# Patient Record
Sex: Female | Born: 2000 | Hispanic: Yes | Marital: Single | Smoking: Former smoker
Health system: Southern US, Community
[De-identification: ages and names within clinical notes are randomized; demographics above are authoritative.]

## PROBLEM LIST (undated history)

## (undated) DIAGNOSIS — Z789 Other specified health status: Secondary | ICD-10-CM

## (undated) HISTORY — PX: NO PAST SURGERIES: SHX2092

## (undated) HISTORY — DX: Other specified health status: Z78.9

---
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
## 2021-07-12 ENCOUNTER — Ambulatory Visit (LOCAL_COMMUNITY_HEALTH_CENTER): Payer: Self-pay

## 2021-07-12 VITALS — BP 108/61 | Ht 61.02 in | Wt 118.5 lb

## 2021-07-12 DIAGNOSIS — Z3201 Encounter for pregnancy test, result positive: Secondary | ICD-10-CM

## 2021-07-12 LAB — PREGNANCY, URINE: Preg Test, Ur: POSITIVE — AB

## 2021-07-12 MED ORDER — PRENATAL 27-0.8 MG PO TABS: 1.0000 | ORAL_TABLET | Freq: Every day | ORAL | 0 refills | Status: AC
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
## 2021-07-25 ENCOUNTER — Telehealth: Payer: Self-pay

## 2021-07-25 NOTE — Telephone Encounter (Signed)
TC to patient who has new OB visit at [facility] tomorrow. Per notes in [facility] epic, patient was to start care today at [facility]. Call with interpreter to find out if patient plans to have maternity care at [facility] or elsewhere. LM with number to call. Intepreter V. Olmedo.Burt Knack, RN  ?

## 2021-07-27 NOTE — Telephone Encounter (Signed)
Telephone call to [facility] Department this morning to inquire about New OB appointment on 07-25-2021.  Per Verlon Au in the [facility] clinic, patient cancelled her new OB appointment.  Per [facility] policy, they do not follow-up with patient who cancel their own appointments for new appointments. Hart Carwin, RN ? ?

## 2021-07-27 NOTE — Telephone Encounter (Signed)
Telephone call to patient today regarding where she would like to have her prenatal care.  She has decided to go with [facility] due to it being closer.  Appointment was actually yesterday 07-26-2021, but was cancelled due to staffing.  New appointment is scheduled for 08-03-2021 at 2:20 pm (patient thought it was 2:45).  Language Line used today.  Felicita Gage / IV:[id number] Dahlia Bailiff, RN ? ?

## 2021-08-03 ENCOUNTER — Ambulatory Visit: Payer: Medicaid Other | Admitting: Advanced Practice Midwife

## 2021-08-03 ENCOUNTER — Encounter: Payer: Self-pay | Admitting: Advanced Practice Midwife

## 2021-08-03 DIAGNOSIS — T7412XS Child physical abuse, confirmed, sequela: Secondary | ICD-10-CM

## 2021-08-03 DIAGNOSIS — Z23 Encounter for immunization: Secondary | ICD-10-CM

## 2021-08-03 DIAGNOSIS — Z3480 Encounter for supervision of other normal pregnancy, unspecified trimester: Secondary | ICD-10-CM | POA: Insufficient documentation

## 2021-08-03 DIAGNOSIS — T7412XA Child physical abuse, confirmed, initial encounter: Secondary | ICD-10-CM | POA: Insufficient documentation

## 2021-08-03 DIAGNOSIS — Z3481 Encounter for supervision of other normal pregnancy, first trimester: Secondary | ICD-10-CM | POA: Diagnosis not present

## 2021-08-03 DIAGNOSIS — Z7289 Other problems related to lifestyle: Secondary | ICD-10-CM

## 2021-08-03 DIAGNOSIS — Z3401 Encounter for supervision of normal first pregnancy, first trimester: Secondary | ICD-10-CM | POA: Insufficient documentation

## 2021-08-03 HISTORY — DX: Encounter for supervision of other normal pregnancy, unspecified trimester: Z34.80

## 2021-08-03 LAB — URINALYSIS
Bilirubin, UA: NEGATIVE
Glucose, UA: NEGATIVE
Nitrite, UA: POSITIVE — AB
Protein,UA: NEGATIVE
RBC, UA: NEGATIVE
Specific Gravity, UA: 1.025 (ref 1.005–1.030)
Urobilinogen, Ur: 0.2 mg/dL (ref 0.2–1.0)
pH, UA: 6.5 (ref 5.0–7.5)

## 2021-08-03 LAB — WET PREP FOR TRICH, YEAST, CLUE
Trichomonas Exam: NEGATIVE
Yeast Exam: NEGATIVE

## 2021-08-03 LAB — HEMOGLOBIN, FINGERSTICK: Hemoglobin: 11.8 g/dL (ref 11.1–15.9)

## 2021-08-03 NOTE — Progress Notes (Addendum)
Currently lives with friend and her child who is 20 years old. Has contact with FOB, lives in [place]. Moved to [place] 4 years ago. Confirmed with patient correct spelling of name "Samantha Butler".Confirmed that [facility] record on file is patient's record. After Visit Summary given to patient. [facility] Spanish Interpretor utilized. Alesia Banda RN ?
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
## 2021-08-04 ENCOUNTER — Other Ambulatory Visit: Payer: Self-pay | Admitting: Advanced Practice Midwife

## 2021-08-04 DIAGNOSIS — Z3401 Encounter for supervision of normal first pregnancy, first trimester: Secondary | ICD-10-CM

## 2021-08-04 LAB — LEAD, BLOOD (ADULT >= 16 YRS): Lead-Whole Blood: <1 ug/dL (ref 0.0–3.4)

## 2021-08-05 ENCOUNTER — Telehealth: Payer: Self-pay

## 2021-08-05 LAB — 789231 7+OXYCODONE-BUND
Amphetamines, Urine: NEGATIVE ng/mL
BENZODIAZ UR QL: NEGATIVE ng/mL
Barbiturate screen, urine: NEGATIVE ng/mL
Cannabinoid Quant, Ur: NEGATIVE ng/mL
Cocaine (Metab.): NEGATIVE ng/mL
OPIATE SCREEN URINE: NEGATIVE ng/mL
Oxycodone/Oxymorphone, Urine: NEGATIVE ng/mL
PCP Quant, Ur: NEGATIVE ng/mL

## 2021-08-05 LAB — HGB FRACTIONATION CASCADE
Hgb A2: 2.8 % (ref 1.8–3.2)
Hgb A: 96.7 % (ref 96.4–98.8)
Hgb F: 0.5 % (ref 0.0–2.0)
Hgb S: 0 %

## 2021-08-05 LAB — CHLAMYDIA/GC NAA, CONFIRMATION
Chlamydia trachomatis, NAA: NEGATIVE
Neisseria gonorrhoeae, NAA: NEGATIVE

## 2021-08-05 NOTE — Telephone Encounter (Signed)
Call to [facility], [facility]) MFM [place] scheduler to obtain US appt for client. Order updated in Epic yesterday by Hazle Coca CNM and paper referral faxed this am with confirmation received. Britta Mccreedy requested phone number to return call as unable to schedule at this time as involved with CPT codes. Number for her to call provided. Jossie Ng, RN ? ?

## 2021-08-06 LAB — URINE CULTURE

## 2021-08-08 ENCOUNTER — Encounter: Payer: Self-pay | Admitting: Advanced Practice Midwife

## 2021-08-08 ENCOUNTER — Telehealth: Payer: Self-pay

## 2021-08-08 DIAGNOSIS — O2340 Unspecified infection of urinary tract in pregnancy, unspecified trimester: Secondary | ICD-10-CM

## 2021-08-08 HISTORY — DX: Unspecified infection of urinary tract in pregnancy, unspecified trimester: O23.40

## 2021-08-08 LAB — CBC/D/PLT+RPR+RH+ABO+RUB AB...
Antibody Screen: NEGATIVE
Basophils Absolute: 0 10*3/uL (ref 0.0–0.2)
Basos: 0 %
EOS (ABSOLUTE): 0.1 10*3/uL (ref 0.0–0.4)
Eos: 1 %
Hematocrit: 35.1 % (ref 34.0–46.6)
Hemoglobin: 12 g/dL (ref 11.1–15.9)
Hepatitis B Surface Ag: NEGATIVE
Immature Grans (Abs): 0 10*3/uL (ref 0.0–0.1)
Immature Granulocytes: 0 %
Lymphocytes Absolute: 2.3 10*3/uL (ref 0.7–3.1)
Lymphs: 28 %
MCH: 29.8 pg (ref 26.6–33.0)
MCHC: 34.2 g/dL (ref 31.5–35.7)
MCV: 87 fL (ref 79–97)
Monocytes Absolute: 0.5 10*3/uL (ref 0.1–0.9)
Monocytes: 6 %
Neutrophils Absolute: 5.1 10*3/uL (ref 1.4–7.0)
Neutrophils: 65 %
Platelets: 301 10*3/uL (ref 150–450)
RBC: 4.03 x10E6/uL (ref 3.77–5.28)
RDW: 13.3 % (ref 11.7–15.4)
RPR Ser Ql: NONREACTIVE
Rh Factor: POSITIVE
Rubella Antibodies, IGG: 2.5 index (ref >0.99)
Varicella zoster IgG: 1517 index (ref >165)
WBC: 8 10*3/uL (ref 3.4–10.8)

## 2021-08-08 LAB — HIV-1/HIV-2 QUALITATIVE RNA
HIV-1 RNA, Qualitative: NONREACTIVE
HIV-2 RNA, Qualitative: NONREACTIVE

## 2021-08-08 LAB — QUANTIFERON-TB GOLD PLUS
QuantiFERON Mitogen Value: >10 IU/mL
QuantiFERON Nil Value: 0.15 IU/mL
QuantiFERON TB1 Ag Value: 0.13 IU/mL
QuantiFERON TB2 Ag Value: 0.24 IU/mL
QuantiFERON-TB Gold Plus: NEGATIVE

## 2021-08-08 NOTE — Telephone Encounter (Signed)
Appt for UTI treatment scheduled for 08/11/2021. Samantha Ng, RN ? ?

## 2021-08-08 NOTE — Telephone Encounter (Signed)
Client needs UTI treatment and call made to client to schedule appt by Delynn Flavin RN, but difficulty hearing client and call dropped. [employer] dialed number again with Ms. Thiele on the line. Message left to call regarding appt. Client just returned call and Ms. Cardenas, [facility] clerical, scheduled her for 08/11/2021 with arrival time of 0800. Jossie Ng, RN ? ?

## 2021-08-08 NOTE — Telephone Encounter (Signed)
Appointment needed for Urine Culture result. Called patient via Electrical engineer # [phone].  ?Patient answered phone and discussed the need for an appointment for treatment.  ?However during discussion the patient no longer was responding to the interpretor - period of silence.  ?Interpretor called patient again and call forwarded to voice mail.  ?Spanish Interpretor left message for patient to return call [phone]) and schedule appointment this week.  ?Alesia Banda RN ?

## 2021-08-09 ENCOUNTER — Telehealth: Payer: Self-pay

## 2021-08-09 NOTE — Telephone Encounter (Signed)
Call to client with Samantha Butler to give [facility] MFM [place] appt which is scheduled on 09/13/2021 with arrival time of 1245 (for 1300 [place]). Jossie Ng, RN ? ?

## 2021-08-11 ENCOUNTER — Ambulatory Visit: Payer: Medicaid Other | Admitting: Family Medicine

## 2021-08-11 VITALS — BP 117/60 | HR 101 | Temp 98.4°F | Wt 121.6 lb

## 2021-08-11 DIAGNOSIS — O2342 Unspecified infection of urinary tract in pregnancy, second trimester: Secondary | ICD-10-CM | POA: Diagnosis not present

## 2021-08-11 DIAGNOSIS — Z3402 Encounter for supervision of normal first pregnancy, second trimester: Secondary | ICD-10-CM

## 2021-08-11 MED ORDER — NITROFURANTOIN MONOHYD MACRO 100 MG PO CAPS: 100.0000 mg | ORAL_CAPSULE | Freq: Two times a day (BID) | ORAL | 0 refills | Status: DC

## 2021-08-11 NOTE — Progress Notes (Signed)
Patient here for UTI treatment. States has information about how to get to the hospital for her U/S on 09/13/21. Pediatrician list given to patient along with [facility] pamphlet and patient counseled to choose a BCM method and peds before birth of baby. Plans to breastfeed.Burt Knack, RN  ?
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
## 2021-08-23 NOTE — Addendum Note (Signed)
Addended by: Heywood Bene on: 08/23/2021 02:19 PM ? ? Modules accepted: Orders ? ?

## 2021-08-31 ENCOUNTER — Ambulatory Visit: Payer: Medicaid Other | Admitting: Nurse Practitioner

## 2021-08-31 VITALS — BP 108/60 | HR 100 | Temp 98.5°F | Wt 125.0 lb

## 2021-08-31 DIAGNOSIS — Z3482 Encounter for supervision of other normal pregnancy, second trimester: Secondary | ICD-10-CM | POA: Diagnosis not present

## 2021-08-31 DIAGNOSIS — Z3480 Encounter for supervision of other normal pregnancy, unspecified trimester: Secondary | ICD-10-CM

## 2021-08-31 NOTE — Progress Notes (Signed)
Here today for 17.0 week MH RV. Taking PNV QD. Denies ED/hospital visits since last RV. Urine TOC today. Aware of 09/13/21 [facility] MFM [place] appt. Tawny Hopping, RN

## 2021-09-01 ENCOUNTER — Encounter: Payer: Self-pay | Admitting: Nurse Practitioner
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
## 2021-09-03 LAB — URINE CULTURE

## 2021-09-07 ENCOUNTER — Telehealth: Payer: Self-pay

## 2021-09-07 NOTE — Telephone Encounter (Signed)
TC to patient to inform of UTI and need for treatment. Patient scheduled for Friday morning as an overbook, due to lack of available appointments. Patient counseled to arrive 8:00 am. Patient states understanding. [address] interpreters, [place], [place] [phone].Marland KitchenBurt Knack, RN

## 2021-09-08 ENCOUNTER — Other Ambulatory Visit: Payer: Self-pay

## 2021-09-09 ENCOUNTER — Ambulatory Visit: Payer: Self-pay | Admitting: Advanced Practice Midwife

## 2021-09-09 ENCOUNTER — Telehealth: Payer: Self-pay

## 2021-09-09 VITALS — BP 121/75 | HR 105 | Temp 97.1°F | Wt 127.0 lb

## 2021-09-09 DIAGNOSIS — O2342 Unspecified infection of urinary tract in pregnancy, second trimester: Secondary | ICD-10-CM

## 2021-09-09 DIAGNOSIS — O2340 Unspecified infection of urinary tract in pregnancy, unspecified trimester: Secondary | ICD-10-CM

## 2021-09-09 DIAGNOSIS — Z3482 Encounter for supervision of other normal pregnancy, second trimester: Secondary | ICD-10-CM

## 2021-09-09 DIAGNOSIS — Z3480 Encounter for supervision of other normal pregnancy, unspecified trimester: Secondary | ICD-10-CM

## 2021-09-09 HISTORY — DX: Unspecified infection of urinary tract in pregnancy, unspecified trimester: O23.40

## 2021-09-09 MED ORDER — NITROFURANTOIN MONOHYD MACRO 100 MG PO CAPS: 100.0000 mg | ORAL_CAPSULE | Freq: Two times a day (BID) | ORAL | 0 refills | Status: DC

## 2021-09-09 NOTE — Progress Notes (Signed)
Presents for UTI treatment only. Aware of [facility] M[facility] 09/13/21 [place] appt. Aware of [facility] RV appt 09/28/21. Jossie Ng, RN

## 2021-09-09 NOTE — Telephone Encounter (Signed)
AFP only ordered 08/31/2021 and wt not included in order so test could not be done. Call to Emily and transferred to lab where this test performed. %/24/2023 wt provided and per female, test results should be available in ~ 15 minutes. Rich Number, RN
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
## 2021-09-10 LAB — MATERNIT21  PLUS CORE+ESS+SCA, BLOOD
11q23 deletion (Jacobsen): NOT DETECTED
15q11 deletion (PW Angelman): NOT DETECTED
1p36 deletion syndrome: NOT DETECTED
22q11 deletion (DiGeorge): NOT DETECTED
4p16 deletion(Wolf-Hirschhorn): NOT DETECTED
5p15 deletion (Cri-du-chat): NOT DETECTED
8q24 deletion (Langer-Giedion): NOT DETECTED
Fetal Fraction: 15
Monosomy X (Turner Syndrome): NOT DETECTED
Result (T21): NEGATIVE
Trisomy 13 (Patau syndrome): NEGATIVE
Trisomy 16: NOT DETECTED
Trisomy 18 (Edwards syndrome): NEGATIVE
Trisomy 21 (Down syndrome): NEGATIVE
Trisomy 22: NOT DETECTED
XXX (Triple X Syndrome): NOT DETECTED
XXY (Klinefelter Syndrome): NOT DETECTED
XYY (Jacobs Syndrome): NOT DETECTED

## 2021-09-10 LAB — AFP, SERUM, OPEN SPINA BIFIDA
AFP MoM: 1.21
AFP Value: 53.8 ng/mL
Gest. Age on Collection Date: 17 weeks
Maternal Age At EDD: 20.3 yr
OSBR Risk 1 IN: 6301
Test Results:: NEGATIVE
Weight: 125 [lb_av]

## 2021-09-11 NOTE — Progress Notes (Signed)
Chart reviewed by Pharmacist  Suzanne Walker PharmD, Contract Pharmacist at [facility]  

## 2021-09-13 ENCOUNTER — Other Ambulatory Visit: Payer: Self-pay

## 2021-09-13 ENCOUNTER — Encounter: Payer: Self-pay | Admitting: Advanced Practice Midwife

## 2021-09-13 ENCOUNTER — Ambulatory Visit: Payer: Self-pay | Attending: Obstetrics and Gynecology

## 2021-09-13 DIAGNOSIS — Z3A20 20 weeks gestation of pregnancy: Secondary | ICD-10-CM | POA: Insufficient documentation

## 2021-09-13 DIAGNOSIS — Z3401 Encounter for supervision of normal first pregnancy, first trimester: Secondary | ICD-10-CM

## 2021-09-13 DIAGNOSIS — Z363 Encounter for antenatal screening for malformations: Secondary | ICD-10-CM | POA: Insufficient documentation

## 2021-09-28 ENCOUNTER — Ambulatory Visit: Payer: Self-pay | Admitting: Advanced Practice Midwife

## 2021-09-28 VITALS — BP 111/67 | HR 86 | Temp 97.9°F | Wt 129.4 lb

## 2021-09-28 DIAGNOSIS — O2340 Unspecified infection of urinary tract in pregnancy, unspecified trimester: Secondary | ICD-10-CM

## 2021-09-28 DIAGNOSIS — O2342 Unspecified infection of urinary tract in pregnancy, second trimester: Secondary | ICD-10-CM

## 2021-09-28 DIAGNOSIS — Z3480 Encounter for supervision of other normal pregnancy, unspecified trimester: Secondary | ICD-10-CM

## 2021-09-28 DIAGNOSIS — Z3482 Encounter for supervision of other normal pregnancy, second trimester: Secondary | ICD-10-CM

## 2021-09-28 NOTE — Progress Notes (Signed)
Verified has pediatrician resource list. Jossie Ng, RN
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
## 2021-09-30 LAB — URINE CULTURE

## 2021-10-16 ENCOUNTER — Other Ambulatory Visit: Payer: Self-pay

## 2021-10-16 DIAGNOSIS — Z3A25 25 weeks gestation of pregnancy: Secondary | ICD-10-CM

## 2021-10-16 DIAGNOSIS — Z3480 Encounter for supervision of other normal pregnancy, unspecified trimester: Secondary | ICD-10-CM

## 2021-10-18 ENCOUNTER — Ambulatory Visit: Payer: Self-pay | Attending: Obstetrics and Gynecology

## 2021-10-18 ENCOUNTER — Other Ambulatory Visit: Payer: Self-pay

## 2021-10-18 DIAGNOSIS — Z3A25 25 weeks gestation of pregnancy: Secondary | ICD-10-CM | POA: Insufficient documentation

## 2021-10-18 DIAGNOSIS — O358XX0 Maternal care for other (suspected) fetal abnormality and damage, not applicable or unspecified: Secondary | ICD-10-CM

## 2021-10-18 DIAGNOSIS — Z3492 Encounter for supervision of normal pregnancy, unspecified, second trimester: Secondary | ICD-10-CM | POA: Insufficient documentation

## 2021-10-18 DIAGNOSIS — Z3480 Encounter for supervision of other normal pregnancy, unspecified trimester: Secondary | ICD-10-CM

## 2021-10-26 ENCOUNTER — Ambulatory Visit: Payer: Self-pay | Admitting: Advanced Practice Midwife

## 2021-10-26 ENCOUNTER — Ambulatory Visit: Payer: Self-pay

## 2021-10-26 VITALS — BP 105/63 | HR 91 | Temp 98.6°F | Wt 139.0 lb

## 2021-10-26 DIAGNOSIS — Z3482 Encounter for supervision of other normal pregnancy, second trimester: Secondary | ICD-10-CM

## 2021-10-26 DIAGNOSIS — Z3480 Encounter for supervision of other normal pregnancy, unspecified trimester: Secondary | ICD-10-CM

## 2021-10-26 DIAGNOSIS — O2342 Unspecified infection of urinary tract in pregnancy, second trimester: Secondary | ICD-10-CM

## 2021-10-26 DIAGNOSIS — Z7289 Other problems related to lifestyle: Secondary | ICD-10-CM

## 2021-10-26 DIAGNOSIS — O2340 Unspecified infection of urinary tract in pregnancy, unspecified trimester: Secondary | ICD-10-CM
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
## 2021-11-09 ENCOUNTER — Ambulatory Visit: Payer: Self-pay | Admitting: Advanced Practice Midwife

## 2021-11-09 VITALS — BP 110/62 | HR 87 | Temp 97.9°F | Wt 141.8 lb

## 2021-11-09 DIAGNOSIS — R7309 Other abnormal glucose: Secondary | ICD-10-CM | POA: Insufficient documentation

## 2021-11-09 DIAGNOSIS — Z3483 Encounter for supervision of other normal pregnancy, third trimester: Secondary | ICD-10-CM

## 2021-11-09 DIAGNOSIS — Z7289 Other problems related to lifestyle: Secondary | ICD-10-CM

## 2021-11-09 DIAGNOSIS — Z3480 Encounter for supervision of other normal pregnancy, unspecified trimester: Secondary | ICD-10-CM

## 2021-11-09 DIAGNOSIS — Z23 Encounter for immunization: Secondary | ICD-10-CM

## 2021-11-09 LAB — HEMOGLOBIN, FINGERSTICK: Hemoglobin: 11.5 g/dL (ref 11.1–15.9)
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
## 2021-11-09 NOTE — Progress Notes (Signed)
[facility] Spanish Interpretor utilized: Roddie Mc. Glucola given with instructions. TDAP Spanish VIS given. In House Hgb WNL. Shiela Mayer RN

## 2021-11-11 ENCOUNTER — Telehealth: Payer: Self-pay

## 2021-11-11 LAB — HCV INTERPRETATION

## 2021-11-11 LAB — HIV-1/HIV-2 QUALITATIVE RNA
HIV-1 RNA, Qualitative: NONREACTIVE
HIV-2 RNA, Qualitative: NONREACTIVE

## 2021-11-11 LAB — GLUCOSE, 1 HOUR GESTATIONAL: Gestational Diabetes Screen: 136 mg/dL (ref 70–139)

## 2021-11-11 LAB — RPR: RPR Ser Ql: NONREACTIVE

## 2021-11-11 LAB — HCV AB W REFLEX TO QUANT PCR: HCV Ab: NONREACTIVE

## 2021-11-11 NOTE — Telephone Encounter (Signed)
Telephone call to patient today regarding her abnormal 1 HR GTT=136.  She needs a 3 HR GTT scheduled.  Left message for her to call [phone] and ask for the maternity nurses to schedule a repeat lab appointment.  Language Line used today.  Rexford Maus / [phone].  Hart Carwin, RN

## 2021-11-11 NOTE — Telephone Encounter (Signed)
Return call back from patient regarding her abnormal 1 HR GTT and the need for a 3 HR GTT.  [name], clerical staff scheduled 3 HR GTT for 11-16-21 at 8:40 am.  Patient told nothing to eat or drink after midnight the night before.  Telephone back to patient regarding her abnormal 1 HR GTT=136 and to give more instruction about the 3 HR GTT and what the appointment would look like for her on 11-16-21.  Patient verbalizes understanding.  Language Line used today.  Lawanna Kobus / [phone]  Leonor Liv / [phone].  Hart Carwin, RN

## 2021-11-16 ENCOUNTER — Other Ambulatory Visit: Payer: Self-pay

## 2021-11-16 DIAGNOSIS — O99810 Abnormal glucose complicating pregnancy: Secondary | ICD-10-CM

## 2021-11-16 NOTE — Progress Notes (Signed)
In nurse clinic for 3 hr GTT. Npo since 10 pm last night. Instructions reviewed for test today. Advised to notify RN if n/v. [employer] interpreter Nancy Fetter # [phone]. Jerel Shepherd, RN

## 2021-11-17 ENCOUNTER — Other Ambulatory Visit: Payer: Self-pay | Admitting: Advanced Practice Midwife

## 2021-11-17 DIAGNOSIS — R7309 Other abnormal glucose: Secondary | ICD-10-CM

## 2021-11-17 LAB — GLUCOSE TOLERANCE TEST, 6 HOUR
Glucose, 1 Hour GTT: 190 mg/dL (ref 70–199)
Glucose, 2 hour: 148 mg/dL — ABNORMAL HIGH (ref 70–139)
Glucose, 3 hour: 138 mg/dL — ABNORMAL HIGH (ref 70–109)
Glucose, GTT - Fasting: 87 mg/dL (ref 70–99)

## 2021-11-23 ENCOUNTER — Ambulatory Visit: Payer: Self-pay | Admitting: Nurse Practitioner

## 2021-11-23 ENCOUNTER — Encounter: Payer: Self-pay | Admitting: Nurse Practitioner

## 2021-11-23 VITALS — BP 118/63 | HR 90 | Temp 98.2°F | Wt 146.2 lb

## 2021-11-23 DIAGNOSIS — Z3480 Encounter for supervision of other normal pregnancy, unspecified trimester: Secondary | ICD-10-CM

## 2021-11-23 DIAGNOSIS — Z3483 Encounter for supervision of other normal pregnancy, third trimester: Secondary | ICD-10-CM
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
## 2021-12-07 ENCOUNTER — Ambulatory Visit: Payer: Self-pay | Admitting: Advanced Practice Midwife

## 2021-12-07 VITALS — BP 119/69 | HR 91 | Temp 97.6°F | Wt 149.2 lb

## 2021-12-07 DIAGNOSIS — R7309 Other abnormal glucose: Secondary | ICD-10-CM

## 2021-12-07 DIAGNOSIS — Z3480 Encounter for supervision of other normal pregnancy, unspecified trimester: Secondary | ICD-10-CM

## 2021-12-07 DIAGNOSIS — Z3483 Encounter for supervision of other normal pregnancy, third trimester: Secondary | ICD-10-CM

## 2021-12-07 MED ORDER — PRENATAL MULTIVITAMIN CH: 1.0000 | ORAL_TABLET | Freq: Every day | ORAL | 0 refills | Status: AC
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
## 2021-12-07 NOTE — Progress Notes (Signed)
Patient here for MH RV at [redacted]w[redacted]d.   Kick counts done today.   Patient states she has not yet heard from nutrition counseling. Call to [place] from [facility] and LVM so she can follow-up with nutrition referral. Referral was placed 11/17/21.   Earlyne Iba, RN

## 2021-12-21 ENCOUNTER — Ambulatory Visit: Payer: Self-pay | Admitting: Advanced Practice Midwife

## 2021-12-21 VITALS — BP 115/71 | HR 89 | Temp 97.9°F | Wt 152.4 lb

## 2021-12-21 DIAGNOSIS — R7309 Other abnormal glucose: Secondary | ICD-10-CM

## 2021-12-21 DIAGNOSIS — Z3480 Encounter for supervision of other normal pregnancy, unspecified trimester: Secondary | ICD-10-CM

## 2021-12-21 LAB — URINALYSIS
Bilirubin, UA: NEGATIVE
Glucose, UA: NEGATIVE
Ketones, UA: NEGATIVE
Nitrite, UA: NEGATIVE
Protein,UA: NEGATIVE
RBC, UA: NEGATIVE
Specific Gravity, UA: 1.015 (ref 1.005–1.030)
Urobilinogen, Ur: 0.2 mg/dL (ref 0.2–1.0)
pH, UA: 7 (ref 5.0–7.5)
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
## 2021-12-21 NOTE — Progress Notes (Addendum)
Confirmed with [facility] that patient had diet counseling 09/06. [facility] Spanish Interpretor utilized Suzette Battiest). Provider reviewed In House urine result. BThiele RN

## 2021-12-23 ENCOUNTER — Other Ambulatory Visit: Payer: Self-pay | Admitting: Nurse Practitioner

## 2021-12-23 DIAGNOSIS — Z3480 Encounter for supervision of other normal pregnancy, unspecified trimester: Secondary | ICD-10-CM

## 2021-12-23 LAB — URINE CULTURE

## 2021-12-23 NOTE — Progress Notes (Signed)
U/S referral submitted for size greater than dating. Glenna Fellows, FNP

## 2021-12-27 ENCOUNTER — Other Ambulatory Visit: Payer: Self-pay

## 2021-12-27 DIAGNOSIS — Z3A36 36 weeks gestation of pregnancy: Secondary | ICD-10-CM

## 2022-01-03 ENCOUNTER — Ambulatory Visit: Payer: Self-pay

## 2022-01-04 ENCOUNTER — Ambulatory Visit: Payer: Self-pay | Admitting: Advanced Practice Midwife

## 2022-01-04 VITALS — BP 105/58 | HR 100 | Temp 98.5°F | Wt 155.8 lb

## 2022-01-04 DIAGNOSIS — Z3480 Encounter for supervision of other normal pregnancy, unspecified trimester: Secondary | ICD-10-CM

## 2022-01-04 DIAGNOSIS — R7309 Other abnormal glucose: Secondary | ICD-10-CM
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
## 2022-01-04 NOTE — Progress Notes (Signed)
Patient here for MH RV at [redacted]w[redacted]d.   Patient states she did receive ADA diet counseling appt on 12/14/21.   Patient also stated today that she would like to change her delivering practice hospital from [facility] to [facility]. Patient has an [place] at [facility] on 01/12/22 and provider made aware of hospital change. Provider states it would be best to keep [facility] [place] appt and any further [place] will be at [facility].   GBS cultures self - collected. Cultures walked to lab by RN.   Al Decant, RN

## 2022-01-08 LAB — CHLAMYDIA/GC NAA, CONFIRMATION
Chlamydia trachomatis, NAA: NEGATIVE
Neisseria gonorrhoeae, NAA: NEGATIVE

## 2022-01-08 LAB — CULTURE, BETA STREP (GROUP B ONLY): Strep Gp B Culture: NEGATIVE

## 2022-01-10 ENCOUNTER — Ambulatory Visit: Payer: Self-pay

## 2022-01-10 ENCOUNTER — Other Ambulatory Visit: Payer: Self-pay

## 2022-01-10 DIAGNOSIS — Z3A36 36 weeks gestation of pregnancy: Secondary | ICD-10-CM

## 2022-01-11 ENCOUNTER — Ambulatory Visit: Payer: Self-pay | Admitting: Advanced Practice Midwife

## 2022-01-11 DIAGNOSIS — Z23 Encounter for immunization: Secondary | ICD-10-CM

## 2022-01-11 DIAGNOSIS — Z3480 Encounter for supervision of other normal pregnancy, unspecified trimester: Secondary | ICD-10-CM

## 2022-01-11 DIAGNOSIS — Z3483 Encounter for supervision of other normal pregnancy, third trimester: Secondary | ICD-10-CM

## 2022-01-11 NOTE — Progress Notes (Signed)
Flu vaccine given today, tolerated well, VIS given, NCIR given.Jenetta Downer, RN

## 2022-01-11 NOTE — Progress Notes (Signed)
Patient needs to return in 1 week, patient had to be overbooked due to no available maternity appointments.Jenetta Downer, RN

## 2022-01-11 NOTE — Progress Notes (Signed)
Patient here for MH RV at 47 6/7. [facility] contact card given. Aware of U/S tomorrow at [facility] MFM. Desires flu vaccine today. Jenetta Downer, RN
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
## 2022-01-12 ENCOUNTER — Ambulatory Visit: Payer: Self-pay | Attending: Obstetrics

## 2022-01-12 ENCOUNTER — Other Ambulatory Visit: Payer: Self-pay

## 2022-01-12 DIAGNOSIS — O26843 Uterine size-date discrepancy, third trimester: Secondary | ICD-10-CM | POA: Insufficient documentation

## 2022-01-12 DIAGNOSIS — Z3A38 38 weeks gestation of pregnancy: Secondary | ICD-10-CM | POA: Insufficient documentation

## 2022-01-12 DIAGNOSIS — Z3A36 36 weeks gestation of pregnancy: Secondary | ICD-10-CM

## 2022-01-17 ENCOUNTER — Ambulatory Visit: Payer: Self-pay | Admitting: Advanced Practice Midwife

## 2022-01-17 VITALS — BP 121/77 | HR 85 | Temp 97.3°F | Wt 161.2 lb

## 2022-01-17 DIAGNOSIS — Z3480 Encounter for supervision of other normal pregnancy, unspecified trimester: Secondary | ICD-10-CM

## 2022-01-17 DIAGNOSIS — Z3483 Encounter for supervision of other normal pregnancy, third trimester: Secondary | ICD-10-CM

## 2022-01-17 DIAGNOSIS — R7309 Other abnormal glucose: Secondary | ICD-10-CM

## 2022-01-17 LAB — URINALYSIS
Bilirubin, UA: NEGATIVE
Glucose, UA: NEGATIVE
Ketones, UA: NEGATIVE
Nitrite, UA: NEGATIVE
RBC, UA: NEGATIVE
Specific Gravity, UA: 1.025 (ref 1.005–1.030)
Urobilinogen, Ur: 0.2 mg/dL (ref 0.2–1.0)
pH, UA: 7 (ref 5.0–7.5)
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
## 2022-01-17 NOTE — Addendum Note (Signed)
Addended by: Cletis Media on: 01/17/2022 03:35 PM   Modules accepted: Orders

## 2022-01-17 NOTE — Progress Notes (Signed)
Patient here for MH RV at 18 5/7. See U/S report from 01/12/22 in outguide.Jenetta Downer, RN

## 2022-01-18 LAB — PROTEIN / CREATININE RATIO, URINE
Creatinine, Urine: 99 mg/dL
Protein, Ur: 43.8 mg/dL
Protein/Creat Ratio: 442 mg/g creat — ABNORMAL HIGH (ref 0–200)

## 2022-01-26 ENCOUNTER — Ambulatory Visit: Payer: Self-pay | Admitting: Advanced Practice Midwife

## 2022-01-26 VITALS — BP 125/76 | HR 97 | Temp 97.2°F | Wt 162.0 lb

## 2022-01-26 DIAGNOSIS — Z3483 Encounter for supervision of other normal pregnancy, third trimester: Secondary | ICD-10-CM

## 2022-01-26 DIAGNOSIS — Z3480 Encounter for supervision of other normal pregnancy, unspecified trimester: Secondary | ICD-10-CM

## 2022-01-26 LAB — URINALYSIS
Bilirubin, UA: NEGATIVE
Glucose, UA: NEGATIVE
Ketones, UA: NEGATIVE
Leukocytes,UA: NEGATIVE
Nitrite, UA: NEGATIVE
Specific Gravity, UA: 1.03 (ref 1.005–1.030)
Urobilinogen, Ur: 1 mg/dL (ref 0.2–1.0)
pH, UA: 6.5 (ref 5.0–7.5)
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
## 2022-01-26 NOTE — Progress Notes (Signed)
Patient here for MH RV at 40 weeks. Needs IOL referral for [facility].Jenetta Downer, RN

## 2022-01-26 NOTE — Progress Notes (Signed)
[facility] IOL referral faxed with snapshot and LM for Patrice at [facility] L&D, confirmation received. [facility] message sent to Corena Herter to alert her to patient delivery location change to [facility] and per Cecille Rubin, she will abstract patient information from [place].Jenetta Downer, RN

## 2022-02-02 ENCOUNTER — Ambulatory Visit: Payer: Self-pay | Admitting: Advanced Practice Midwife

## 2022-02-02 ENCOUNTER — Telehealth: Payer: Self-pay

## 2022-02-02 ENCOUNTER — Ambulatory Visit: Payer: Self-pay

## 2022-02-02 ENCOUNTER — Encounter: Payer: Self-pay | Admitting: Advanced Practice Midwife

## 2022-02-02 VITALS — BP 132/93 | HR 100 | Temp 99.4°F | Wt 164.8 lb

## 2022-02-02 DIAGNOSIS — O1495 Unspecified pre-eclampsia, complicating the puerperium: Secondary | ICD-10-CM

## 2022-02-02 DIAGNOSIS — O9279 Other disorders of lactation: Secondary | ICD-10-CM

## 2022-02-02 DIAGNOSIS — R6252 Short stature (child): Secondary | ICD-10-CM

## 2022-02-02 DIAGNOSIS — O1490 Unspecified pre-eclampsia, unspecified trimester: Secondary | ICD-10-CM

## 2022-02-02 NOTE — Telephone Encounter (Signed)
TC to patient to schedule an appointment with [facility] for next week. Per Ola Spurr, patient needs to be seen on 02/06/22/ or 02/07/22 in the afternoon. LM for patient, LM for emergency contact. Then 2nd call to patient who answered. Patient scheduled for 02/07/2022 and counseled to arrive at 1:00pm. Patient states understanding. [address] [place], CV#[id number].Marland KitchenJenetta Downer, RN
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
## 2022-02-02 NOTE — Progress Notes (Signed)
Patient here for C-section incision check . C-section on 01/28/22. States she is having pain. Feeding breast milk and formula. States she was having breast pain, but pain is reduced, but she states her breasts are hard.Jenetta Downer, RN

## 2022-02-06 ENCOUNTER — Telehealth: Payer: Self-pay | Admitting: Licensed Clinical Social Worker
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
## 2022-02-07 ENCOUNTER — Ambulatory Visit: Payer: Self-pay | Admitting: Advanced Practice Midwife

## 2022-02-07 VITALS — BP 128/69 | HR 113 | Temp 99.3°F | Resp 16 | Wt 145.0 lb

## 2022-02-07 DIAGNOSIS — R7309 Other abnormal glucose: Secondary | ICD-10-CM

## 2022-02-07 NOTE — Progress Notes (Signed)
S: 21 yo G2P1  here for BP check pp after 01/28/22 LTCS followed by T-incision due to severely impacted fetal head, pp hemorrhage of 500 cc and preeclampsia with BP's 153/101, 141/100, 145/104 and IOL.  Presented on 02/02/22 with abdominal pain on left side, engorged breasts, severe LE edema and BP 132/93 and was sent to [facility] ER for evaluation. Presents today as f/u to that O: 128/69, HR 113, 145 lbs, today and feeling much better.  Abdomen without erythema or pain, [id number] above umbilicus, steri strips removed with clean incision without erythema or drainage. Breasts soft and using electric pump q 1 1/2-2 hours+formula 1-2 oz TID Denies h/a, scotoma, epigastric pain. LE 3+ PHQ-9=3 A/P: elevated LFT's at [facility] on 02/02/22--need repeat at pp apt FOB helping pt on paternity leave but goes back to work today. Pt accepts referral to Milton Ferguson, LCSW for counseling and signed [facility] consent form Taking FeSo4 I daily with oj [employer] [place] lochia Baby gained 1 lbs and is doing well per peds

## 2022-02-07 NOTE — Progress Notes (Signed)
Patient here for BP check.   BP = 128/69 HR = 113 Tmp = 99.71F Wt = 145lbs   Al Decant, RN

## 2022-02-09 ENCOUNTER — Encounter: Payer: Self-pay | Admitting: Licensed Clinical Social Worker

## 2022-02-09 ENCOUNTER — Ambulatory Visit: Payer: Self-pay | Admitting: Licensed Clinical Social Worker

## 2022-02-09 DIAGNOSIS — F4321 Adjustment disorder with depressed mood: Secondary | ICD-10-CM
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
## 2022-02-13 ENCOUNTER — Ambulatory Visit: Payer: Self-pay | Admitting: Licensed Clinical Social Worker

## 2022-02-13 DIAGNOSIS — F4321 Adjustment disorder with depressed mood: Secondary | ICD-10-CM

## 2022-02-13 NOTE — Progress Notes (Signed)
Counselor/Therapist Progress Note  Patient ID: Samantha Butler, MRN: [id number],    Date: 02/13/2022  Time Spent: 20 minutes    Treatment Type: Psychotherapy  Reported Symptoms:  much better, more relaxed, increased support from friends   Mental Status Exam:  Appearance:   NA     Behavior:  Appropriate and Sharing  Motor:  NA  Speech/Language:   Clear and Coherent and Normal Rate  Affect:  NA  Mood:  normal  Thought process:  normal  Thought content:    WNL  Sensory/Perceptual disturbances:    WNL  Orientation:  oriented to person, place, time/date, and situation  Attention:  Good  Concentration:  Good  Memory:  WNL  Fund of knowledge:   Good  Insight:    Fair  Judgment:   Fair  Impulse Control:  Fair   Risk Assessment: Danger to Self:  No Self-injurious Behavior: No Danger to Others: No Duty to Warn:no Physical Aggression / Violence:No  Access to Firearms a concern: No  Gang Involvement:No   Subjective: Patient was engaged and cooperative throughout the session responding to questions. She reports she is doing much better and that she has had support from her friends. She denies any concerns at this time and also reports that she did follow up with the emergency room regarding facial paralysis. Patient agreed to a follow up appointment and prefers virtual.   Interventions:  Brief assessment, health education/psychoeducation  Checked in with patient and conducted brief assessment of symptoms sleeping, appetite and mood have all improved due to increased support from friends. LCSW reviewed symptoms to watch for, including Feelings of hopelessness and total despair. Feeling out of touch with reality (hearing or seeing things other people don't). Feeling that you might hurt yourself or your baby. Provided support through active listening, validation of feelings, and highlighted patient's strengths.   Diagnosis:   ICD-10-CM   1. Adjustment disorder with  depressed mood  F43.21      Plan: Continue to monitor postpartum mood and anxiety symptoms.   Future Appointments  Date Time Provider [place]  02/16/2022  1:20 PM [facility] PROVIDER [facility] None  03/06/2022 11:00 AM Milton Ferguson, LCSW [facility] None   Interpreter used: Marlene [place]   Milton Ferguson, [place]

## 2022-02-16 ENCOUNTER — Ambulatory Visit: Payer: Self-pay | Admitting: Physician Assistant

## 2022-02-16 ENCOUNTER — Encounter: Payer: Self-pay | Admitting: Physician Assistant

## 2022-02-16 DIAGNOSIS — Z0130 Encounter for examination of blood pressure without abnormal findings: Secondary | ICD-10-CM

## 2022-02-16 MED ORDER — CEPHALEXIN 500 MG PO CAPS: 500.0000 mg | ORAL_CAPSULE | Freq: Three times a day (TID) | ORAL | 0 refills | Status: DC

## 2022-02-16 NOTE — Progress Notes (Signed)
BP 118/79; HR 99; 99.3; 134.4 lbs Patient states here for C/S check (c/o yellow drainage) and BP check. Veronica [facility] Spanish Interpretor utilized. Breastfeeding. BThiele RN

## 2022-02-16 NOTE — Progress Notes (Signed)
S: Pt presents for BP and C/S incision check after primary LTCS with T uterine incision at 40 2/7 wk on 01/28/22 at [facility] due to arrest of dilation/fetal intolerance of labor. Had preeclampsia without severe features, not put on BP meds. Of note, developed Bells palsy pp and is being treated with predisone taper and acyclovir. 100% breastfeeding without problems, and baby is doing well. Had Nexplanon insertion prior to hospital discharge. Denies HA, visual sx, edema, RUQ pain, N/V. Notes some yellow drainage from the middle of her C/S incision over the last several days. O: pleasant woman in NAD. BP 118/79, P 99, T 99.3. Heart RRR. Lungs CTA. No pedal or hand edema. Exam of abdomen shows gauze dressing held in place with blue tape over center third of horizontal suprapubic incision. Wound edges are consistently well-approximated. There is no erythema. There is yellow drainage onto the gauze, which is removed. There is a small pore in the center of the wound incision which drains about 2cc of thin yellow cloudy material with pressure from a Fox swab next to the incision. There is no induration or mass effect. A/P: H/o preeclampsia - normotensive today. Continue to self-monitor for preeclampsia sx and seek urgent care if HA, edema, visual sx or RUQ develop. C/S wound infection - local wound care and start cephalexin 500mg  po tid for 7 d. Seek urgent care if N/V or increased pain/drainage or if temp >100.2. Pt verified she has a thermometer at home. Return 1 week for wound check.  Will need routine postpartum exam in 4 weeks.

## 2022-02-16 NOTE — Addendum Note (Signed)
Addended by: Landry Dyke on: 02/16/2022 07:45 PM   Modules accepted: Orders

## 2022-02-16 NOTE — Addendum Note (Signed)
Addended by: Landry Dyke on: 02/16/2022 07:13 PM   Modules accepted: Orders

## 2022-02-23 ENCOUNTER — Ambulatory Visit: Payer: Self-pay | Admitting: Physician Assistant

## 2022-02-23 ENCOUNTER — Encounter: Payer: Self-pay | Admitting: Physician Assistant

## 2022-02-23 VITALS — BP 103/64 | HR 85 | Temp 98.7°F | Resp 16

## 2022-02-23 DIAGNOSIS — Z0130 Encounter for examination of blood pressure without abnormal findings: Secondary | ICD-10-CM

## 2022-02-23 DIAGNOSIS — Z5189 Encounter for other specified aftercare: Secondary | ICD-10-CM

## 2022-02-23 NOTE — Progress Notes (Signed)
S: Pt presents for BP and C/S incision check after primary LTCS with T uterine incision at 40 2/7 wk on 01/28/22 at [facility] due to arrest of dilation/fetal intolerance of labor. Had preeclampsia without severe features, not put on BP meds. Here for 1 week f/o of C/S incision infection and BP check. Has been taking cephalexin, though reports has 2 days left. Denies any probs with antibiotic. Reports no more drainage from incision, no pain, redness or swelling at incision site. No fever, N/V. Still 100% breastfeeding without problems, and baby is doing well. No preeclampsia sx, including HA, visual sx, edema, RUQ pain. No new sx. Pt concerned about 6 lb wt loss.  O: pleasant woman in NAD. Afebrile and normotensive. No pedal or hand edema. Exam of abdomen shows no dressing over incision. Wound edges are consistently well-approximated. There is no drainage, induration or erythema at or around the incision. A/P: H/o preeclampsia - continues to be normotensive today. Continue to self-monitor for preeclampsia sx and seek urgent care if HA, edema, visual sx or RUQ develop. C/S wound infection - healing well. Complete remaining few doses of cephalexin. Schedule routine postpartum exam in 3 weeks. Reassured that her wt loss is normal pp, esp in light of breastfeeding. Enc frequent snacks and po hydration. Due to language barrier, an interpreter [facility] interpreter Byrd Hesselbach) was present via phone during the history-taking and subsequent discussion (and the physical exam) with this patient. Landry Dyke, PA-C

## 2022-02-23 NOTE — Progress Notes (Signed)
Patient here at [facility] [redacted]w[redacted]d for incision check and BP check.   VS today:   BP = 103/64 HR: 85 RR: 16 Temp: 98.62f  Patient states she is taking ABX TID and has enough for today and will complete it tomorrow.   Earlyne Iba, RN

## 2022-02-25 NOTE — Progress Notes (Signed)
Chart reviewed by Pharmacist  Suzanne Walker PharmD, Contract Pharmacist at [facility]  

## 2022-02-28 HISTORY — PX: OTHER SURGICAL HISTORY: SHX169

## 2022-03-06 ENCOUNTER — Ambulatory Visit: Payer: Self-pay | Admitting: Licensed Clinical Social Worker

## 2022-03-16 ENCOUNTER — Encounter: Payer: Self-pay | Admitting: Physician Assistant

## 2022-03-16 ENCOUNTER — Ambulatory Visit: Payer: Self-pay | Admitting: Physician Assistant

## 2022-03-16 VITALS — BP 111/69 | HR 89 | Temp 98.1°F | Wt 124.6 lb

## 2022-03-16 DIAGNOSIS — Z3009 Encounter for other general counseling and advice on contraception: Secondary | ICD-10-CM

## 2022-03-16 LAB — HEMOGLOBIN, FINGERSTICK: Hemoglobin: 12.9 g/dL (ref 11.1–15.9)

## 2022-03-16 LAB — HM HIV SCREENING LAB: HM HIV Screening: NEGATIVE

## 2022-03-16 NOTE — Progress Notes (Signed)
[facility] Department  Postpartum Exam  Samantha Butler is a 21 y.o. G17P1011 female who presents for a postpartum visit. She is  [redacted]w[redacted]d  postpartum following a primary cesarean section.  I have fully reviewed the prenatal and intrapartum course. The delivery was at [redacted]w[redacted]d gestational weeks due to arrest of descent. Had some elevated BP, elevated AST and UPC 829.  Anesthesia: epidural. Postpartum course has been complicated by C/s wound infection and development of Bell's palsy. Baby is doing well. Baby is feeding by breast. Bleeding no bleeding. Bowel function is normal. Bladder function is normal. Patient is sexually active. Contraception method is condoms and Nexplanon. Postpartum depression screening: negative.   The pregnancy intention screening data noted above was reviewed. Potential methods of contraception were discussed. The patient elected to proceed with Hormonal Implant.   Edinburgh Postnatal Depression Scale - 03/16/22 1616       Edinburgh Postnatal Depression Scale:  In the Past 7 Days   I have been able to laugh and see the funny side of things. 0    I have looked forward with enjoyment to things. 0    I have blamed myself unnecessarily when things went wrong. 1    I have been anxious or worried for no good reason. 0    I have felt scared or panicky for no good reason. 0    Things have been getting on top of me. 0    I have been so unhappy that I have had difficulty sleeping. 0    I have felt sad or miserable. 0    I have been so unhappy that I have been crying. 0    The thought of harming myself has occurred to me. 0    Edinburgh Postnatal Depression Scale Total 1             Health Maintenance Due  Topic Date Due   HPV VACCINES (1 - 2-dose series) Never done   HIV Screening  Never done    Review of Systems Pertinent items noted in HPI and remainder of comprehensive ROS otherwise negative. Pt only notes wt changes related to recent  pregnancy.  Objective:  BP 111/69   Pulse 89   Temp 98.1 F (36.7 C)   Wt 124 lb 9.6 oz (56.5 kg)   Breastfeeding Yes Comment: And bottle  BMI 25.17 kg/m    General:  alert, cooperative, appears stated age, no distress, and L side of face with mild loss of expressiveness.   Breasts:  Lactating, no dominant mass  Lungs: clear to auscultation bilaterally  Heart:  regular rate and rhythm, S1, S2 normal, no murmur, click, rub or gallop  Abdomen: soft, non-tender; bowel sounds normal; no masses,  no organomegaly   Wound well approximated incision, no tenderness, drainage or erythema  GU exam:  normal       Assessment:    1. Family planning services Continue Nexplanon. Pt reminded that any future pregnancies would need delivery by C/s at 37 wk due to uterine T incision with this delivery. Pt states understanding.   2. Postpartum exam Hgb 12.5 today.  - Syphilis Serology, [facility] - HIV [place] [facility] - Hemoglobin, fingerstick   Plan:   Essential components of care per ACOG recommendations:  1.  Mood and well being: Patient with negative depression screening today. Reviewed local resources for support.  - Patient tobacco use? No.   - hx of drug use? No.  2. Infant care and feeding:  -Patient currently breastmilk feeding? Yes. Reviewed importance of draining breast regularly to support lactation.  -Social determinants of health (SDOH) reviewed in EPIC. No concerns: none 3. Sexuality, contraception and birth spacing - Patient does not want a pregnancy in the next year.  Desired family size is 3 children.  - Reviewed reproductive life planning. Reviewed options based on patient desire and reproductive life plan. Patient is interested in Hormonal Implant. This was not provided to the patient today. Pt already has implant (done pp at hospital). if not why not clearly documented  Risks, benefits, and typical effectiveness rates were reviewed.  Questions were answered.  Written  information was also given to the patient to review.    The patient will follow up in  12 months for surveillance.  The patient was told to call with any further questions, or with any concerns about this method of contraception.  Emphasized use of condoms 100% of the time for STI prevention.  Patient was not offered ECP based on > 120 hours .    - Discussed birth spacing of 18 months  4. Sleep and fatigue -Encouraged family/partner/community support of 4 hrs of uninterrupted sleep to help with mood and fatigue  5. Physical Recovery  - Discussed patients delivery and complications. She describes her labor as mixed. - Patient had a C-section failure to progress.  Wound healing well. Patient expressed understanding - Patient has urinary incontinence? No. - Patient is safe to resume physical and sexual activity  6.  Health Maintenance - HM due items addressed Yes - Last pap smear No results found for: "DIAGPAP" Pap smear not done at today's visit. First Pap due age 73. -Breast Cancer screening indicated? No.   7. Chronic Disease/Pregnancy Condition follow up: None Bell's palsy resolving, has already completed antiviral/steroid course.  - PCP follow up  Landry Dyke, PA-C [facility]

## 2022-03-16 NOTE — Progress Notes (Signed)
Hgb = 12.9, no intervention needed per standing order.  Martyn Malay, RN

## 2023-05-28 ENCOUNTER — Ambulatory Visit: Payer: Self-pay

## 2023-06-06 IMAGING — US US MFM OB COMP +14 WKS
1 series · 13 of 28 positions shown · non-contrast
Comparison: none

[Series 1: us mfm ob comp +14 wks · 98 acquisitions, 13 frames shown]
[im 4/98]
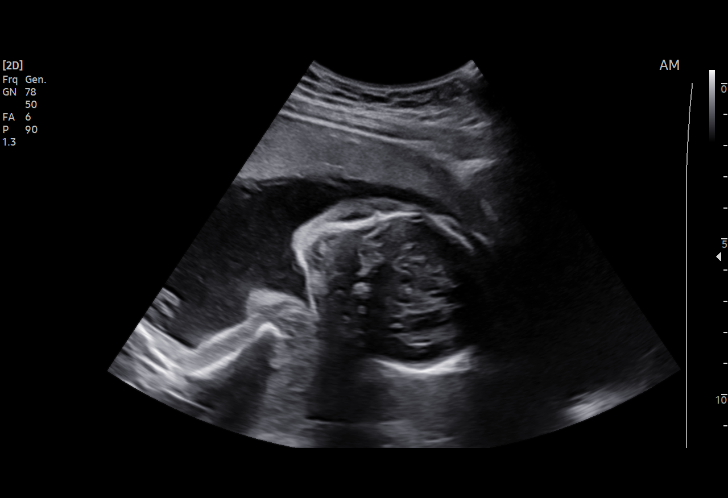
[im 11/98]
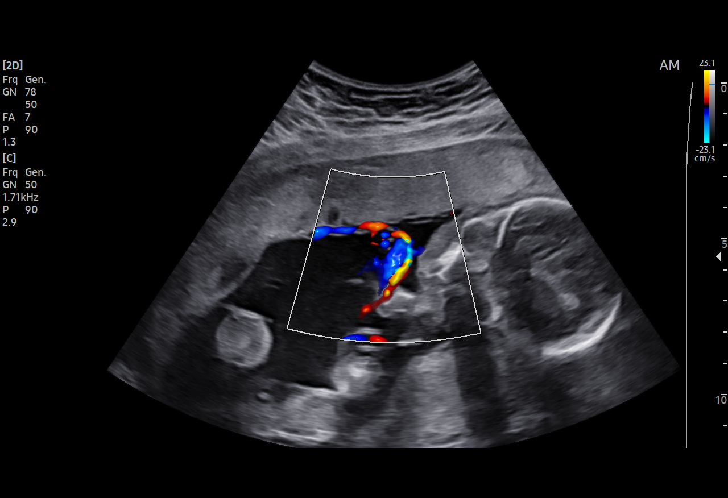
[im 18/98]
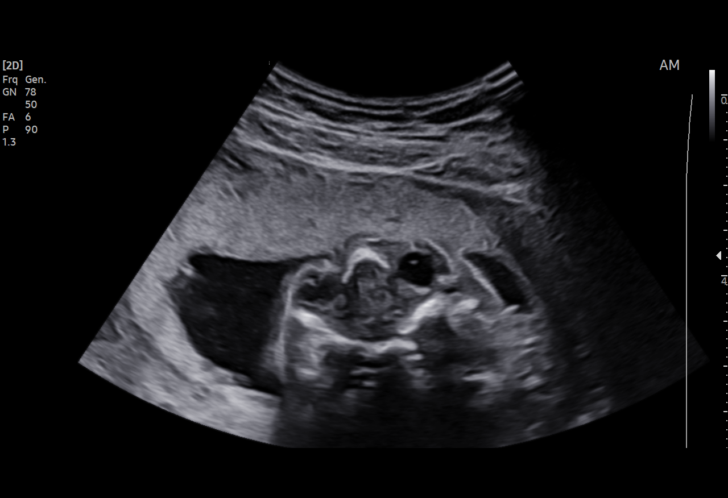
[im 26/98]
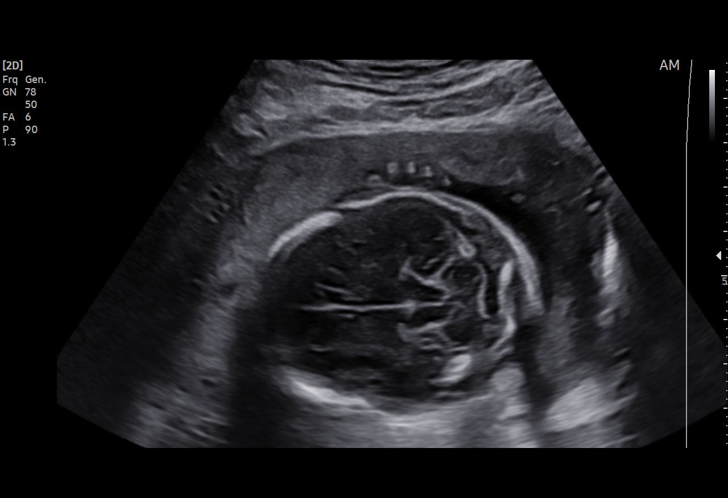
[im 33/98]
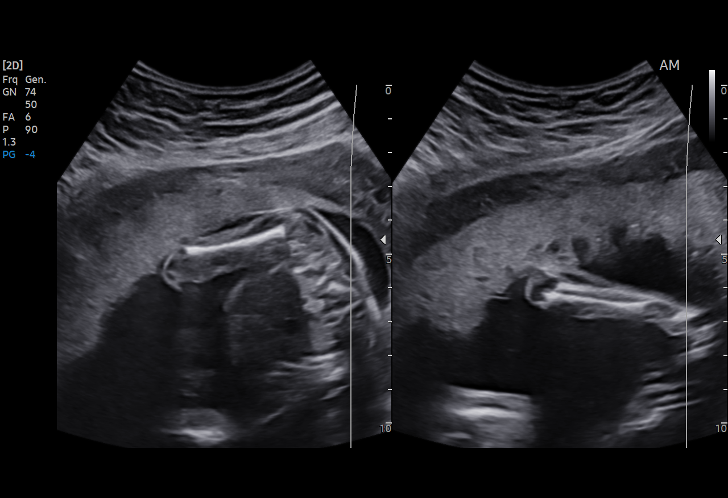
[im 40/98]
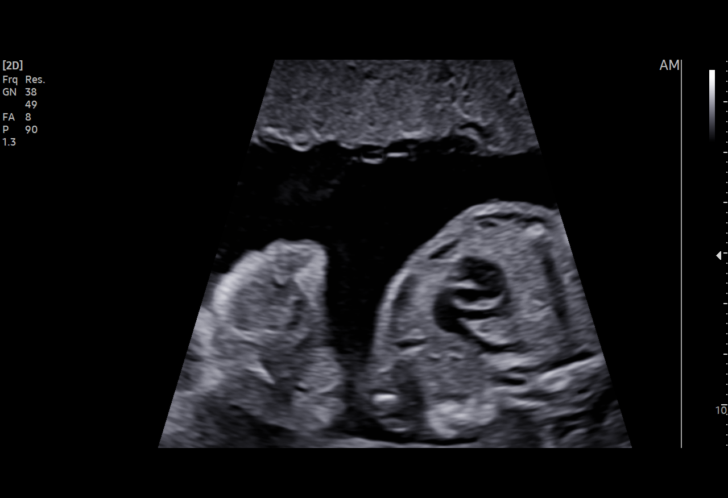
[im 51/98]
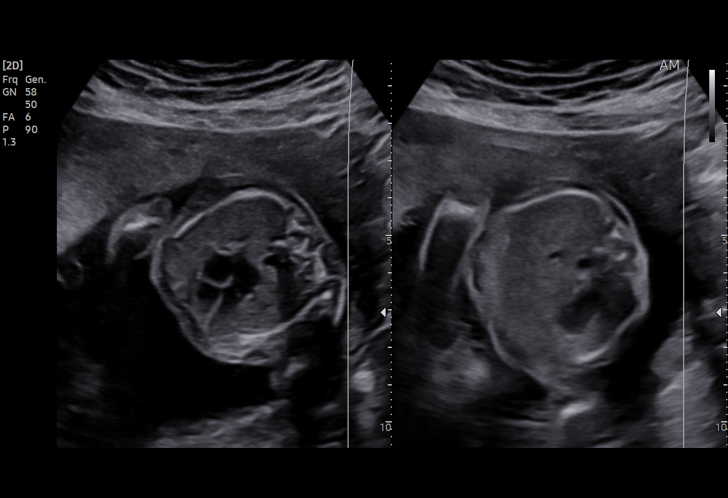
[im 58/98]
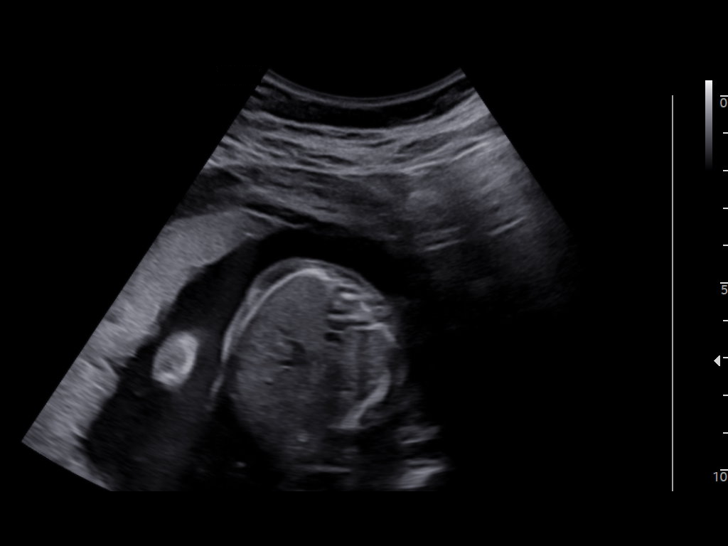
[im 65/98]
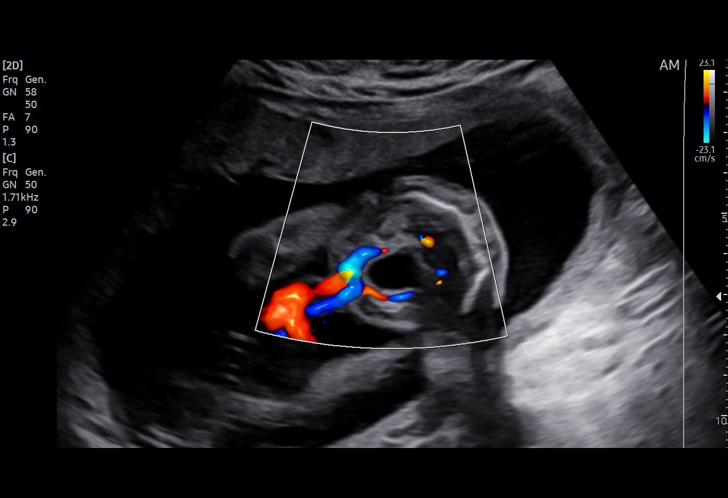
[im 72/98]
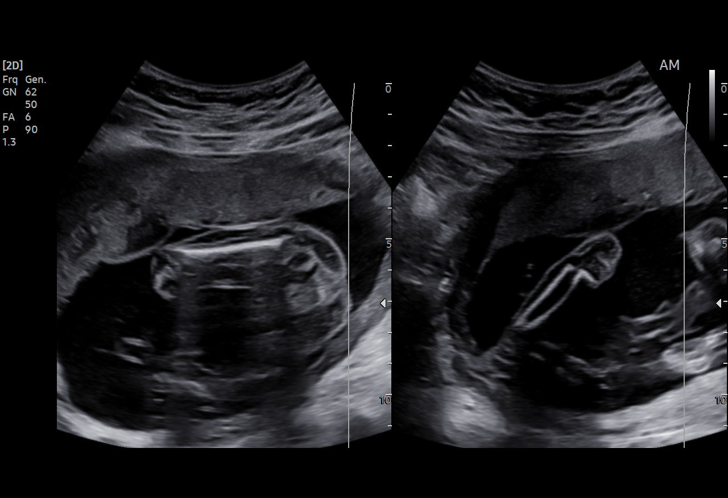
[im 80/98]
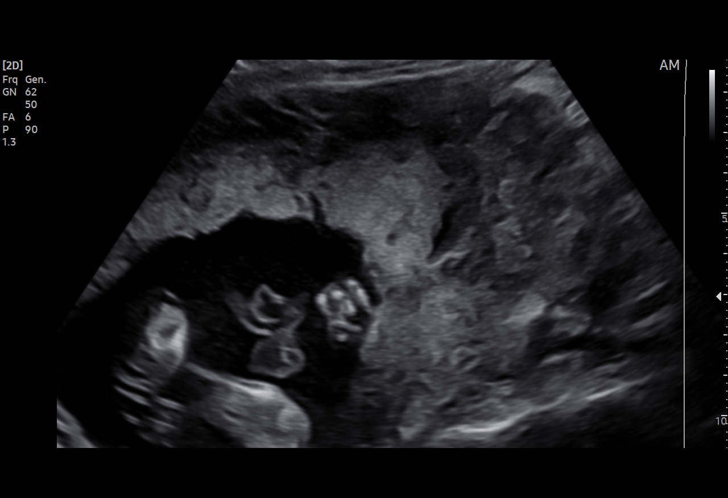
[im 87/98]
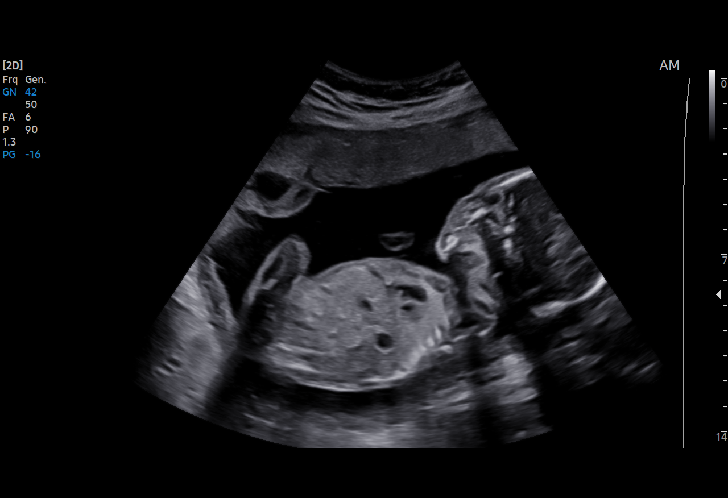
[im 94/98]
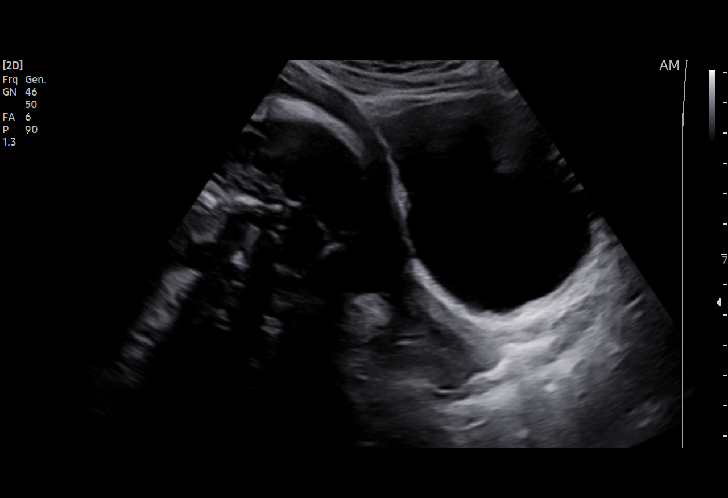

[13 of 28 positions shown; findings below may reference images not displayed]

JARRYL

                   CORDERO CNM

 1  US MFM OB COMP + 14 WK                [address]805.01    HIDDO [id number]

Indications

 20 weeks gestation of pregnancy
 Antenatal screening for malformations
 Mat 21-Neg/AFP-Neg
Fetal Evaluation

 Num Of Fetuses:         1
 Fetal Heart Rate(bpm):  162
 Cardiac Activity:       Observed
 Presentation:           Cephalic
 Placenta:               Anterior
 P. Cord Insertion:      Previously Visualized

 Amniotic Fluid
 AFI FV:      Within normal limits
Biometry

 BPD:      49.4  mm     G. Age:  21w 0d         59  %    CI:        73.49   %    70 - 86
                                                         FL/HC:      18.0   %    15.9 -
 HC:      183.1  mm     G. Age:  20w 5d         40  %    HC/AC:      1.16        1.06 -
 AC:      157.4  mm     G. Age:  20w 6d         49  %    FL/BPD:     66.6   %
 FL:       32.9  mm     G. Age:  20w 2d         27  %    FL/AC:      20.9   %    20 - 24
 CER:      20.8  mm     G. Age:  19w 6d         36  %
 NFT:       4.8  mm
 LV:        7.5  mm
 CM:          3  mm
 Est. FW:     367  gm    0 lb 13 oz      41  %
OB History

 Gravidity:    2         Term:   0        Prem:   0        SAB:   1
 TOP:          0       Ectopic:  0        Living: 0
Gestational Age

 LMP:           18w 6d        Date:  05/04/21                  EDD:   02/08/22
 U/S Today:     20w 5d                                        EDD:   01/26/22
 Best:          20w 5d     Det. By:  U/S (09/13/21)           EDD:   01/26/22
Anatomy

 Cranium:               Appears normal         Aortic Arch:            Appears normal
 Cavum:                 Appears normal         Ductal Arch:            Not well visualized
 Ventricles:            Appears normal         Diaphragm:              Appears normal
 Choroid Plexus:        Appears normal         Stomach:                Appears normal, left
                                                                       sided
 Cerebellum:            Appears normal         Abdomen:                Appears normal
 Posterior Fossa:       Appears normal         Abdominal Wall:         Appears nml (cord
                                                                       insert, abd wall)
 Nuchal Fold:           Appears normal         Cord Vessels:           Appears normal (3
                                                                       vessel cord)
 Face:                  Appears normal         Kidneys:                Appear normal
                        (orbits and profile)
 Lips:                  Appears normal         Bladder:                Appears normal
 Thoracic:              Appears normal         Spine:                  Appears normal
 Heart:                 Appears normal         Upper Extremities:      Appears normal
                        (4CH, axis, and
                        situs)
 RVOT:                  Appears normal         Lower Extremities:      Appears normal
 LVOT:                  Appears normal

 Other:  VC, 3VV and 3VTV visualized. Heels/feet and open hands/5th digits
         visualized. Nasal bone, lenses, maxilla, mandible and falx visualized.
         Fetus appears to be a male.
Cervix Uterus Adnexa

 Cervix
 Length:           3.27  cm.
 Normal appearance by transabdominal scan.

 Uterus
 No abnormality visualized.

 Right Ovary
 Size(cm)     1.99   x   2.15   x  1.59      Vol(ml):
 Within normal limits.

 Left Ovary
 Size(cm)     2.75   x   2.28   x  1.56      Vol(ml):
 Within normal limits.
Impression
 G2 P0.  Patient is here for fetal anatomy scan.
 On cell-free fetal DNA screening, the risks of fetal
 aneuploidies are not increased .MSAFP screening showed
 low risk for open-neural tube defects .

 We performed fetal anatomy scan. No makers of
 aneuploidies or fetal structural defects are seen. Fetal
 biometry is consistent with 20w 5d gestation. Amniotic fluid is
 normal and good fetal activity is seen. Patient understands
 the limitations of ultrasound in detecting fetal anomalies.
 Patient is unsure of her LMP date and this is here first
 ultrasound. I discussed the discrepancy in measurements.
 I have assigned her EDD at 01/26/22.
                Masse, Solo
